# Patient Record
Sex: Female | Born: 1960 | Race: White | Hispanic: No | Marital: Married | Smoking: Current every day smoker
Health system: Southern US, Community
[De-identification: ages and names within clinical notes are randomized; demographics above are authoritative.]

## PROBLEM LIST (undated history)

## (undated) DIAGNOSIS — K529 Noninfective gastroenteritis and colitis, unspecified: Secondary | ICD-10-CM

---

## 1997-11-21 ENCOUNTER — Inpatient Hospital Stay (HOSPITAL_COMMUNITY): Admission: AD | Admit: 1997-11-21 | Discharge: 1997-11-23 | Payer: Self-pay | Admitting: Obstetrics and Gynecology

## 1998-04-08 ENCOUNTER — Emergency Department (HOSPITAL_COMMUNITY): Admission: EM | Admit: 1998-04-08 | Discharge: 1998-04-08 | Payer: Self-pay | Admitting: Emergency Medicine

## 1999-06-25 ENCOUNTER — Other Ambulatory Visit: Admission: RE | Admit: 1999-06-25 | Discharge: 1999-06-25 | Payer: Self-pay | Admitting: *Deleted

## 2000-03-02 ENCOUNTER — Other Ambulatory Visit: Admission: RE | Admit: 2000-03-02 | Discharge: 2000-03-02 | Payer: Self-pay | Admitting: Obstetrics and Gynecology

## 2000-09-15 ENCOUNTER — Inpatient Hospital Stay (HOSPITAL_COMMUNITY): Admission: AD | Admit: 2000-09-15 | Discharge: 2000-09-15 | Payer: Self-pay | Admitting: Obstetrics and Gynecology

## 2000-09-22 ENCOUNTER — Inpatient Hospital Stay (HOSPITAL_COMMUNITY): Admission: AD | Admit: 2000-09-22 | Discharge: 2000-09-24 | Payer: Self-pay | Admitting: Obstetrics and Gynecology

## 2000-11-05 ENCOUNTER — Other Ambulatory Visit: Admission: RE | Admit: 2000-11-05 | Discharge: 2000-11-05 | Payer: Self-pay | Admitting: Obstetrics and Gynecology

## 2001-11-10 ENCOUNTER — Other Ambulatory Visit: Admission: RE | Admit: 2001-11-10 | Discharge: 2001-11-10 | Payer: Self-pay | Admitting: Obstetrics and Gynecology

## 2003-08-14 ENCOUNTER — Encounter: Admission: RE | Admit: 2003-08-14 | Discharge: 2003-08-14 | Payer: Self-pay | Admitting: Obstetrics and Gynecology

## 2004-10-31 ENCOUNTER — Encounter: Admission: RE | Admit: 2004-10-31 | Discharge: 2004-10-31 | Payer: Self-pay | Admitting: Obstetrics and Gynecology

## 2005-12-16 ENCOUNTER — Encounter: Admission: RE | Admit: 2005-12-16 | Discharge: 2005-12-16 | Payer: Self-pay | Admitting: Obstetrics and Gynecology

## 2006-06-14 ENCOUNTER — Ambulatory Visit (HOSPITAL_COMMUNITY): Admission: RE | Admit: 2006-06-14 | Discharge: 2006-06-14 | Payer: Self-pay | Admitting: Obstetrics and Gynecology

## 2006-06-14 ENCOUNTER — Encounter (INDEPENDENT_AMBULATORY_CARE_PROVIDER_SITE_OTHER): Payer: Self-pay | Admitting: *Deleted

## 2007-03-17 ENCOUNTER — Encounter: Admission: RE | Admit: 2007-03-17 | Discharge: 2007-03-17 | Payer: Self-pay | Admitting: Obstetrics and Gynecology

## 2008-04-19 ENCOUNTER — Encounter: Admission: RE | Admit: 2008-04-19 | Discharge: 2008-04-19 | Payer: Self-pay | Admitting: Obstetrics and Gynecology

## 2009-04-22 ENCOUNTER — Encounter: Admission: RE | Admit: 2009-04-22 | Discharge: 2009-04-22 | Payer: Self-pay | Admitting: Obstetrics and Gynecology

## 2010-09-16 ENCOUNTER — Encounter
Admission: RE | Admit: 2010-09-16 | Discharge: 2010-09-16 | Payer: Self-pay | Source: Home / Self Care | Attending: Obstetrics and Gynecology | Admitting: Obstetrics and Gynecology

## 2011-02-06 NOTE — Op Note (Signed)
[name], Jocelyn West               ACCOUNT NO.:  [id number]   MEDICAL RECORD NO.:  [id number]          PATIENT TYPE:  AMB   LOCATION:  SDC                           FACILITY:  [facility]   PHYSICIAN:  Lenoard Aden, M.D.DATE OF BIRTH:  08/09/1961   DATE OF PROCEDURE:  06/14/2006  DATE OF DISCHARGE:                                 OPERATIVE REPORT   PREOPERATIVE DIAGNOSES:  1. Dysfunctional uterine bleeding.  2. Menorrhagia.   POSTOPERATIVE DIAGNOSES:  Large endometrial polyp.   OPERATION/PROCEDURE:  1. Diagnostic hysteroscopy.  2. Resectoscopic polypectomy.  3. Dilatation and curettage.   SURGEON:  Lenoard Aden, M.D.   ANESTHESIA:  General.   ESTIMATED BLOOD LOSS:  Less than 50 mL.   COMPLICATIONS:  None.   FLUID DEFICIT:  85 mL.   CONDITION:  To the recovery room in good condition.   DESCRIPTION OF PROCEDURE:  After being apprised of the risks of anesthesia,  infection, bleeding, injury to abdominal organs and need for repair,  __________  complications, to include bowel and bladder injury, possible  incidence of uterine perforation with need for repair, the patient was  brought to the operating room where she was administered general anesthetic  without complications, prepped and draped in the usual sterile fashion,  catheterized until the bladder was empty.  After achieving adequate  anesthesia, dilute Marcaine solution was placed for paracervical block.  Pitressin solution placed at 3 and 9 o'clock at the cervicovaginal junction.  Cervix easily dilated to #29 [facility] dilator.  Hysteroscope was placed.  Visualization reveals a large, broad-based posterior lateral wall  endometrial polyp which is resected using a double right-angle loop in  multiple passes in its entirety down to the level of the endometrium. Stalk  was resected completely.  There was some thickening of the endometrium along  the  right lateral wall which was resected as well. Bilateral normal tubal  ostia.  No intrauterine fibroids are noted.  At this time good hemostasis is noted.  Irrigated and fluid deficit of 85 mL obtained. All instruments removed.  The  patient tolerated the procedure well and is transferred to the recovery room  in good condition.      Lenoard Aden, M.D.  Electronically Signed     RJT/MEDQ  D:  06/14/2006  T:  06/15/2006  Job:  [id number]

## 2011-02-06 NOTE — H&P (Signed)
[facility]  Patient:    Jocelyn West, Jocelyn West                      MRN: [id number] Adm. Date:  [id number] Disc. Date: [id number] Attending:  Marina Gravel B                         History and Physical  REASON FOR ADMISSION:         1. Intrauterine pregnancy at 39+ weeks.                               2. Regular contractions.  HISTORY OF PRESENT ILLNESS:   This is a 50 year old married white female, gravida 5, para 2, abortus 2, with a due date of September 25, 2000, being admitted at 39 weeks 4 days for regular uterine contractions since 5:15 this morning.  The patient reports good fetal activity.  Denies any leaking of fluid or bleeding, and denies any symptoms of pregnancy-induced hypertension. Upon arrival at maternity admissions, she was contracting every two to four minutes, palpating mild to moderate, with a reactive fetal heart rate, and a vaginal exam of 4-5 cm, 80% effaced, vertex -1, with intact membranes.  PRENATAL LABORATORY DATA:     Blood type B positive.  RPR nonreactive. Rubella immune.  HBsAg negative.  HIV declined.  A 16-week AFP within normal limits.  A 16-week amniocentesis declined.  A 28-week glucose tolerance test was within normal limits.  A 35-week group B strep was negative.  Twenty-week ultrasound revealed a normal anatomy survey with an anterior placenta.  Prenatal course was otherwise uneventful.  ALLERGIES:                    No known drug allergies.  OBSTETRICAL HISTORY:          April 1994, scheduled cesarean section at 39 weeks for a female infant weighing 8 pounds 1 ounce, for breech presentation and failure of external cephalic version.  June 1996, spontaneous miscarriage at 7 weeks, no D&C, no complications.  December 1997, missed abortion at 11 weeks, needing D&C, no complications.  March 1999, spontaneous vaginal delivery at 41 weeks postinduction of a female infant weighing 8 pounds 7 ounces, no complications.  PAST  MEDICAL HISTORY:         Fibroids.  FAMILY HISTORY:               Mother with chronic hypertension.  Maternal grandmother with kidney cancer.  SOCIAL HISTORY:               Married.  Nonsmoker.  Is a Consulting civil engineer at [employer].  PHYSICAL EXAMINATION:  VITAL SIGNS:                  Blood pressure slightly elevated at 130/85.  HEENT:                        Negative.  LUNGS:                        Clear.  HEART:                        Normal.  ABDOMEN:  Gravid, nontender.  PELVIC:                       On vaginal exam 5 cm, 90% effaced, vertex, -1.  EXTREMITIES:                  Negative.  Fetal heart rate tracings reassuring and reactive.  ASSESSMENT:                   1. Intrauterine pregnancy at 39 weeks 4 days.                               2. Status post low transverse cesarean section.                               3. Status post successful vaginal birth after                                  cesarean section.                               4. Active labor.  PLAN:                         The patient is admitted to labor and delivery. Epidural for pain management.  Will do AROM after that. DD:  09/22/00 TD:  09/22/00 Job: [id number]

## 2012-03-23 ENCOUNTER — Other Ambulatory Visit: Payer: Self-pay | Admitting: Obstetrics and Gynecology

## 2012-03-23 DIAGNOSIS — Z1231 Encounter for screening mammogram for malignant neoplasm of breast: Secondary | ICD-10-CM

## 2012-04-25 ENCOUNTER — Ambulatory Visit: Payer: Self-pay

## 2012-04-28 ENCOUNTER — Ambulatory Visit: Payer: Self-pay

## 2012-12-12 ENCOUNTER — Other Ambulatory Visit: Payer: Self-pay

## 2012-12-12 DIAGNOSIS — Z1231 Encounter for screening mammogram for malignant neoplasm of breast: Secondary | ICD-10-CM

## 2013-01-10 ENCOUNTER — Ambulatory Visit
Admission: RE | Admit: 2013-01-10 | Discharge: 2013-01-10 | Disposition: A | Discharge status: Home / Self Care | Payer: BC Managed Care – PPO | Source: Ambulatory Visit

## 2013-01-10 DIAGNOSIS — Z1231 Encounter for screening mammogram for malignant neoplasm of breast: Secondary | ICD-10-CM

## 2015-05-19 ENCOUNTER — Other Ambulatory Visit (HOSPITAL_BASED_OUTPATIENT_CLINIC_OR_DEPARTMENT_OTHER): Payer: Self-pay | Admitting: Family Medicine

## 2015-05-19 ENCOUNTER — Ambulatory Visit (HOSPITAL_BASED_OUTPATIENT_CLINIC_OR_DEPARTMENT_OTHER)
Admission: RE | Admit: 2015-05-19 | Discharge: 2015-05-19 | Disposition: A | Discharge status: Home / Self Care | Payer: Managed Care, Other (non HMO) | Source: Ambulatory Visit | Attending: Family Medicine | Admitting: Family Medicine

## 2015-05-19 DIAGNOSIS — R52 Pain, unspecified: Secondary | ICD-10-CM

## 2015-05-19 DIAGNOSIS — S2232XA Fracture of one rib, left side, initial encounter for closed fracture: Secondary | ICD-10-CM | POA: Insufficient documentation

## 2015-05-19 DIAGNOSIS — W19XXXA Unspecified fall, initial encounter: Secondary | ICD-10-CM | POA: Insufficient documentation

## 2015-05-19 DIAGNOSIS — R079 Chest pain, unspecified: Secondary | ICD-10-CM | POA: Diagnosis present

## 2016-11-27 IMAGING — CR DG RIBS W/ CHEST 3+V*L*
3 series · 3 of 3 positions shown · non-contrast
Comparison: None.

CLINICAL DATA: Status post fall last night with pain under the left
breast.

EXAM:
LEFT RIBS AND CHEST - 3+ VIEW

[w chest pa]
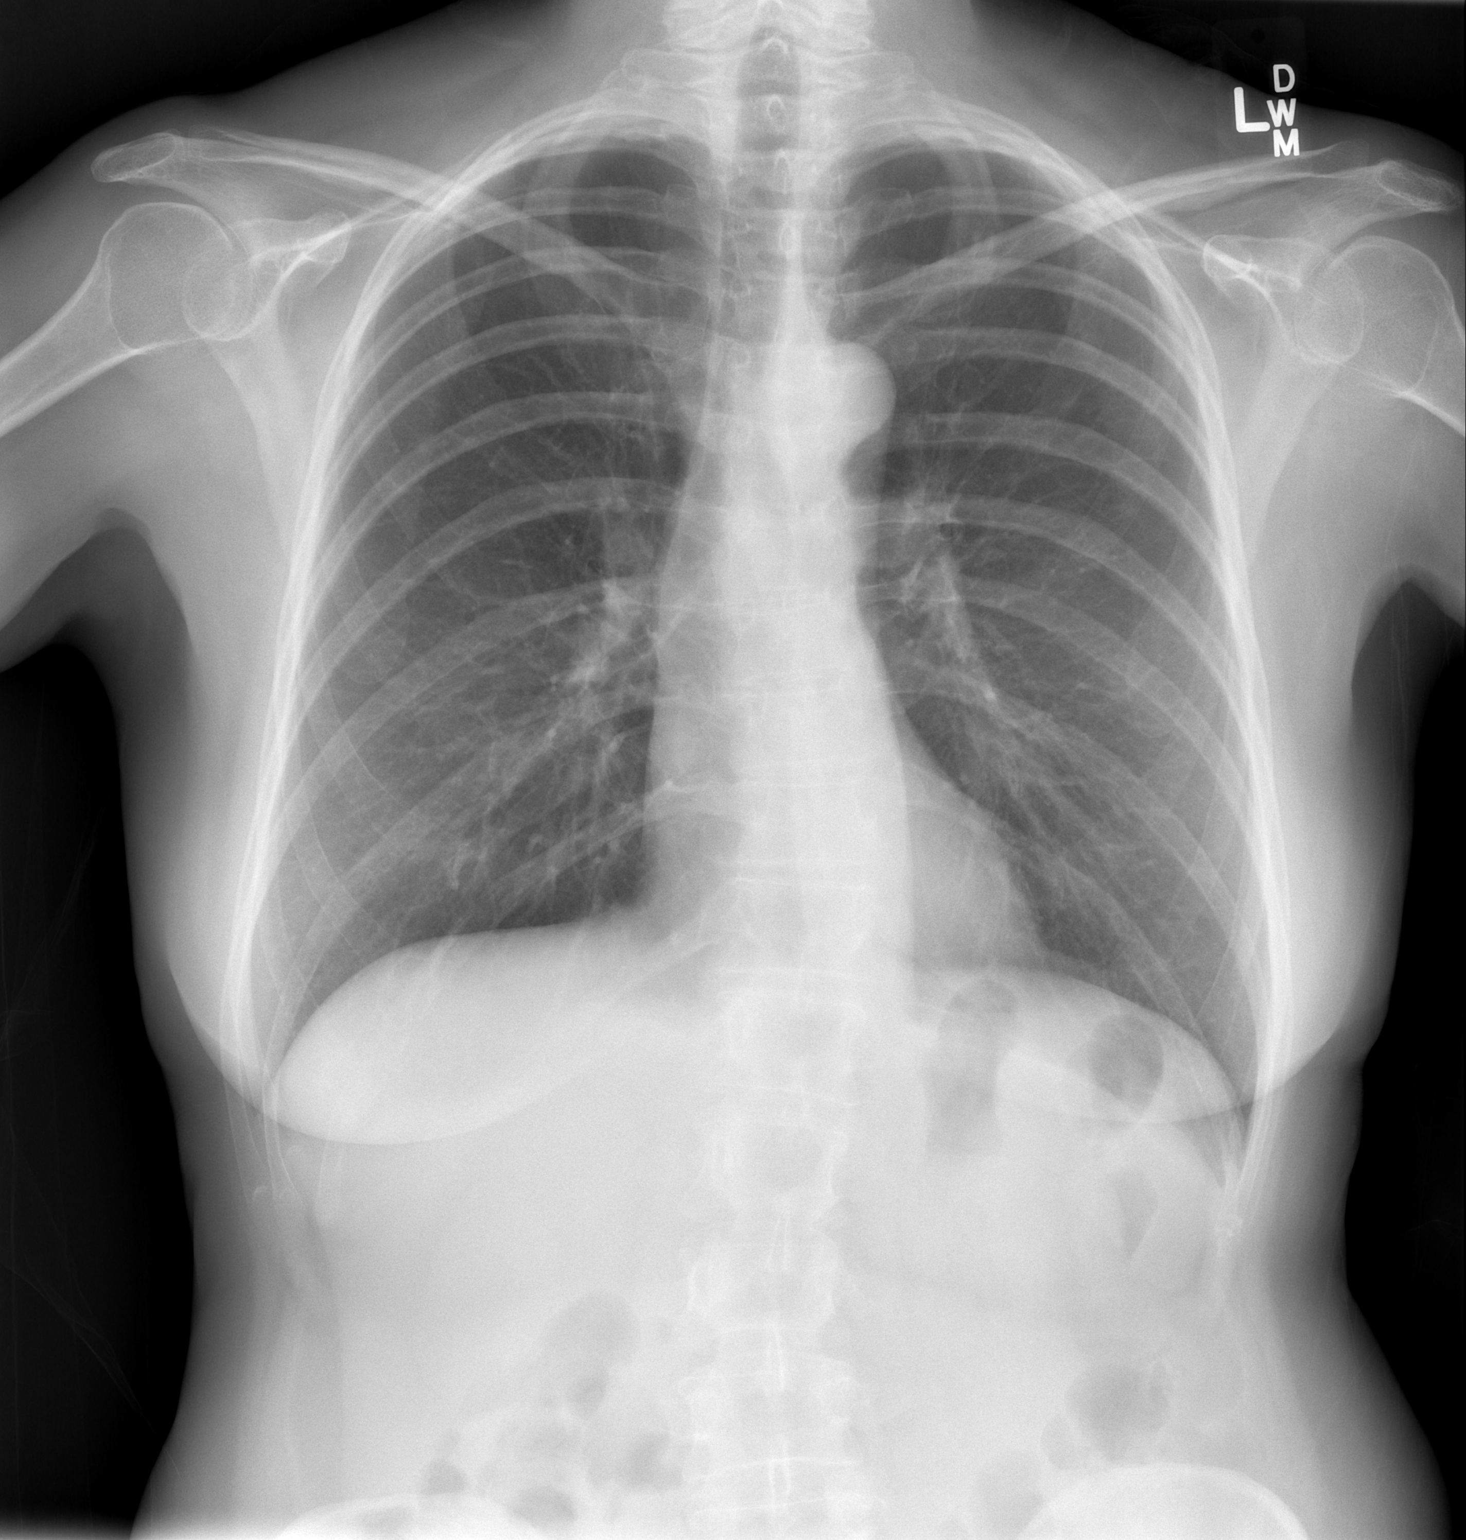

[w ribs ap/pa upper left]
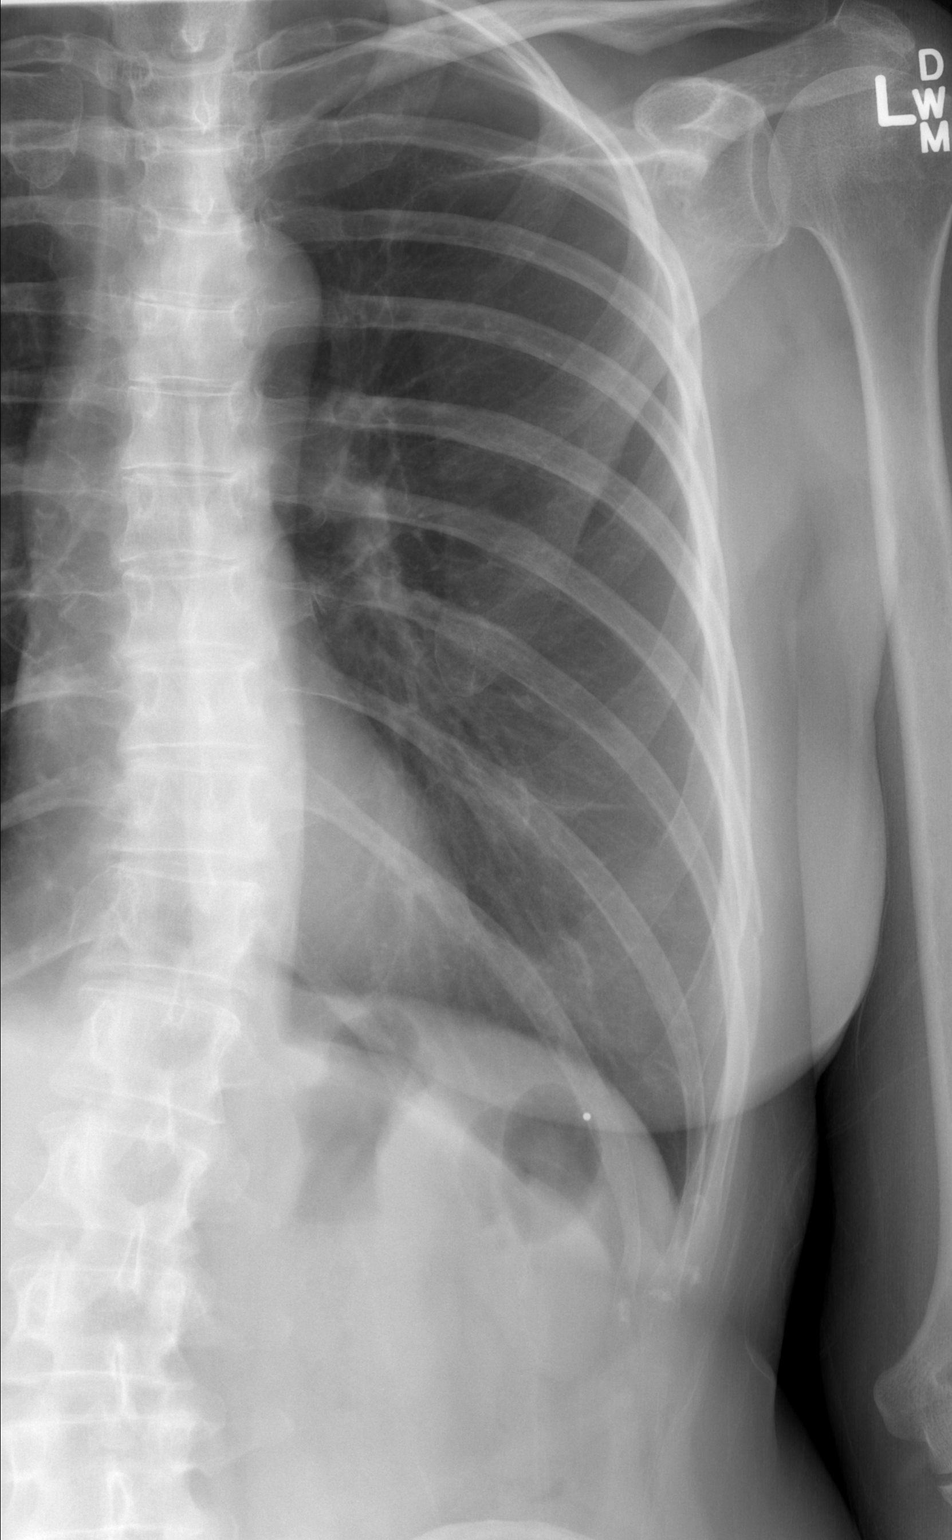

[w ribs ap/pa lower left]
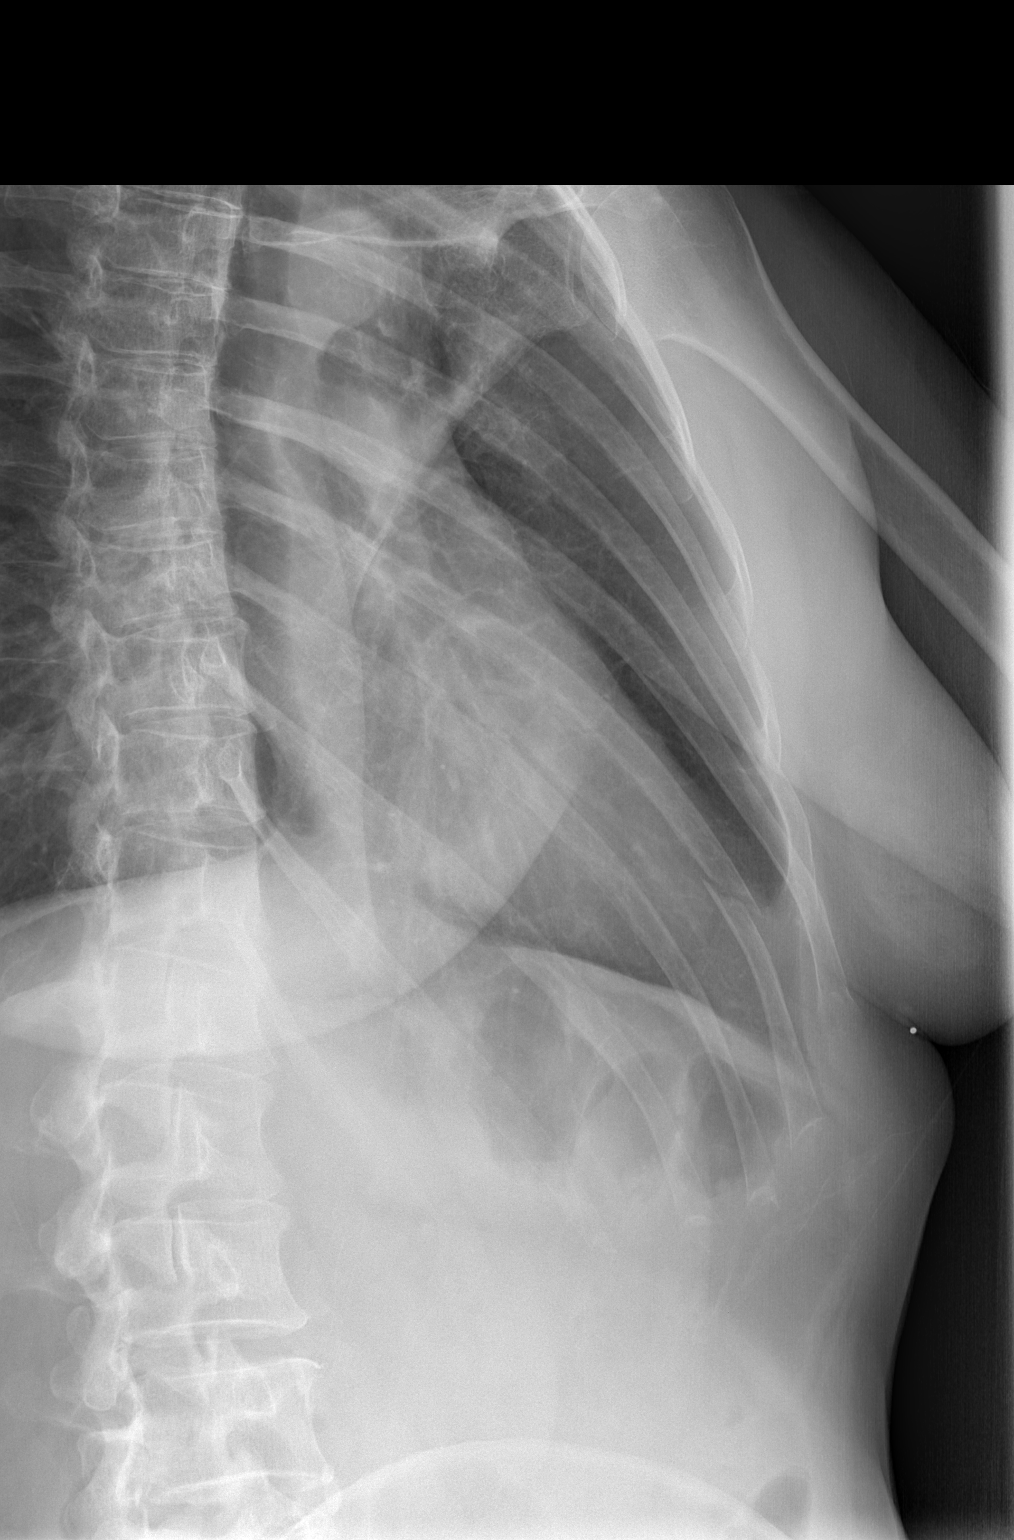

[3 of 3 positions shown; findings below may reference images not displayed]

FINDINGS: There is mild displaced fracture of the lateral left eighth rib.
There is no pneumothorax. There is no dislocation. The lungs are
clear. The mediastinal contour and cardiac silhouette are normal.
There is scoliosis of spine.
IMPRESSION: Fracture of the left eighth rib.

## 2018-07-02 ENCOUNTER — Other Ambulatory Visit: Payer: Self-pay

## 2018-07-02 ENCOUNTER — Encounter (HOSPITAL_BASED_OUTPATIENT_CLINIC_OR_DEPARTMENT_OTHER): Payer: Self-pay | Admitting: *Deleted

## 2018-07-02 ENCOUNTER — Emergency Department (HOSPITAL_BASED_OUTPATIENT_CLINIC_OR_DEPARTMENT_OTHER)
Admission: EM | Admit: 2018-07-02 | Discharge: 2018-07-02 | Disposition: A | Discharge status: Home / Self Care | Payer: Managed Care, Other (non HMO) | Attending: Emergency Medicine | Admitting: Emergency Medicine

## 2018-07-02 DIAGNOSIS — R197 Diarrhea, unspecified: Secondary | ICD-10-CM

## 2018-07-02 DIAGNOSIS — E860 Dehydration: Secondary | ICD-10-CM | POA: Diagnosis not present

## 2018-07-02 DIAGNOSIS — F17200 Nicotine dependence, unspecified, uncomplicated: Secondary | ICD-10-CM | POA: Diagnosis not present

## 2018-07-02 DIAGNOSIS — E878 Other disorders of electrolyte and fluid balance, not elsewhere classified: Secondary | ICD-10-CM | POA: Diagnosis not present

## 2018-07-02 HISTORY — DX: Noninfective gastroenteritis and colitis, unspecified: K52.9

## 2018-07-02 LAB — COMPREHENSIVE METABOLIC PANEL
ALT: 25 U/L (ref 0–44)
AST: 35 U/L (ref 15–41)
Albumin: 4.5 g/dL (ref 3.5–5.0)
Alkaline Phosphatase: 62 U/L (ref 38–126)
Anion gap: 16 — ABNORMAL HIGH (ref 5–15)
BUN: 6 mg/dL (ref 6–20)
CO2: 28 mmol/L (ref 22–32)
Calcium: 10.3 mg/dL (ref 8.9–10.3)
Chloride: 84 mmol/L — ABNORMAL LOW (ref 98–111)
Creatinine, Ser: 0.87 mg/dL (ref 0.44–1.00)
GFR calc Af Amer: >60 mL/min (ref >60)
GFR calc non Af Amer: >60 mL/min (ref >60)
Glucose, Bld: 100 mg/dL — ABNORMAL HIGH (ref 70–99)
Potassium: 2.9 mmol/L — ABNORMAL LOW (ref 3.5–5.1)
Sodium: 128 mmol/L — ABNORMAL LOW (ref 135–145)
Total Bilirubin: 0.6 mg/dL (ref 0.3–1.2)
Total Protein: 7.6 g/dL (ref 6.5–8.1)

## 2018-07-02 LAB — CBC
HCT: 46.6 % — ABNORMAL HIGH (ref 36.0–46.0)
Hemoglobin: 16.6 g/dL — ABNORMAL HIGH (ref 12.0–15.0)
MCH: 33.1 pg (ref 26.0–34.0)
MCHC: 35.6 g/dL (ref 30.0–36.0)
MCV: 92.8 fL (ref 80.0–100.0)
Platelets: 291 10*3/uL (ref 150–400)
RBC: 5.02 MIL/uL (ref 3.87–5.11)
RDW: 12.1 % (ref 11.5–15.5)
WBC: 6.7 10*3/uL (ref 4.0–10.5)
nRBC: 0 % (ref 0.0–0.2)

## 2018-07-02 LAB — URINALYSIS, MICROSCOPIC (REFLEX)

## 2018-07-02 LAB — URINALYSIS, ROUTINE W REFLEX MICROSCOPIC
Bilirubin Urine: NEGATIVE
Glucose, UA: NEGATIVE mg/dL
Ketones, ur: 15 mg/dL — AB
Nitrite: NEGATIVE
Protein, ur: NEGATIVE mg/dL
Specific Gravity, Urine: 1.01 (ref 1.005–1.030)
pH: 5.5 (ref 5.0–8.0)

## 2018-07-02 LAB — MAGNESIUM: Magnesium: 1.7 mg/dL (ref 1.7–2.4)

## 2018-07-02 LAB — LIPASE, BLOOD: Lipase: 34 U/L (ref 11–51)

## 2018-07-02 MED ORDER — POTASSIUM CHLORIDE CRYS ER 20 MEQ PO TBCR
40.0000 meq | EXTENDED_RELEASE_TABLET | Freq: Once | ORAL | Status: AC
  Administered 2018-07-02: 40 meq via ORAL
  Filled 2018-07-02: qty 2

## 2018-07-02 MED ORDER — SODIUM CHLORIDE 0.9 % IV BOLUS
1000.0000 mL | Freq: Once | INTRAVENOUS | Status: AC
  Administered 2018-07-02: 1000 mL via INTRAVENOUS

## 2018-07-02 MED ORDER — FAMOTIDINE 20 MG PO TABS
20.0000 mg | ORAL_TABLET | Freq: Once | ORAL | Status: DC
  Filled 2018-07-02: qty 1

## 2018-07-02 MED ORDER — PROMETHAZINE HCL 25 MG PO TABS
12.5000 mg | ORAL_TABLET | Freq: Once | ORAL | Status: AC
  Administered 2018-07-02: 12.5 mg via ORAL
  Filled 2018-07-02: qty 1

## 2018-07-02 MED ORDER — POTASSIUM CHLORIDE 10 MEQ/100ML IV SOLN
10.0000 meq | Freq: Once | INTRAVENOUS | Status: AC
  Administered 2018-07-02: 10 meq via INTRAVENOUS
  Filled 2018-07-02: qty 100

## 2018-07-02 MED ORDER — METOCLOPRAMIDE HCL 10 MG PO TABS: 10.0000 mg | ORAL_TABLET | Freq: Four times a day (QID) | ORAL | 0 refills | Status: AC

## 2018-07-02 NOTE — ED Notes (Addendum)
Pt. Has been experiencing nausea and  diarrhea for approx. 2 weeks daily .  Pt was seen Wednesday by PA at PCP. She is treating with immodium and was prescribed zofran for nausea. She called PCP today and she states was told she could be dehydrated and was recommended she be treated at an ER or Urgent care to receive fluids.

## 2018-07-02 NOTE — ED Triage Notes (Signed)
Abdominal cramps. Vomiting and diarrhea x 2 weeks. She was seen by her MD for same and states she is here for dehydration.

## 2018-07-02 NOTE — ED Notes (Signed)
States," I feel like a new woman, I feel so much better"

## 2018-07-02 NOTE — ED Provider Notes (Signed)
[facility] EMERGENCY DEPARTMENT Provider Note   CSN: [id number] Arrival date & time: 07/02/18  1154     History   Chief Complaint Chief Complaint  Patient presents with  . Emesis  . Diarrhea    HPI Jocelyn West is a 57 y.o. female.  HPI   Patient is a 57 year old female with history of collagenous colitis who presents the emergency department today for evaluation of nausea and diarrhea that have been present for the last 2 weeks.  Patient reports about 10 episodes of watery, nonbloody diarrhea that have occurred daily for the last 2 weeks.  She has been taking Imodium with mild relief of symptoms.  She has also been taking Zofran with no relief of her nausea.  She denies vomiting, abdominal pain, fevers, chills, dysuria, frequency, urgency or hematuria.  She was told to come to the ED by her PCP as she may need IV fluid hydration due to dehydration.  She was seen by her PCP recently for her symptoms and had C. difficile and GI panel completed.    Reviewed records.  GI panel and C. difficile testing from 06/30/18 were negative.  She was advised by her PCP that if her symptoms continue she will need to follow-up with her GI specialist.  She states she is to be followed by GI for her collagenous colitis and was previously on budesonide.  However, she has not had a flareup in a long time and is no longer taking this medication.  A referral has ready been made for her to see GI however she has not contacted them to make an appointment yet.  Past Medical History:  Diagnosis Date  . Colitis     There are no active problems to display for this patient.   Past Surgical History:  Procedure Laterality Date  . CESAREAN SECTION       OB History   None      Home Medications    Prior to Admission medications   Medication Sig Start Date End Date Taking? Authorizing Provider  clonazePAM (KLONOPIN) 0.5 MG tablet Take 0.5 mg by mouth 2 (two) times daily as needed for  anxiety.   Yes [provider]  LOSARTAN POTASSIUM PO Take by mouth.   Yes [provider]  SIMVASTATIN PO Take by mouth.   Yes [provider]    Family History No family history on file.  Social History Social History   Tobacco Use  . Smoking status: Current Every Day Smoker  . Smokeless tobacco: Never Used  Substance Use Topics  . Alcohol use: Yes  . Drug use: Never     Allergies   Patient has no known allergies.   Review of Systems Review of Systems  Constitutional: Negative for chills and fever.  HENT: Negative for ear pain and sore throat.   Eyes: Negative for visual disturbance.  Respiratory: Negative for cough and shortness of breath.   Cardiovascular: Negative for chest pain.  Gastrointestinal: Positive for diarrhea. Negative for abdominal pain, blood in stool, constipation, nausea and vomiting.  Genitourinary: Negative for dysuria, flank pain, frequency and hematuria.  Musculoskeletal: Negative for back pain.  Skin: Negative for rash.  Neurological: Positive for light-headedness.  All other systems reviewed and are negative.    Physical Exam Updated Vital Signs BP (!) 136/100   Pulse 76   Temp 98.1 F (36.7 C) (Oral)   Resp 15   Ht 5' 3.5" (1.613 m)   Wt 51.7 kg  SpO2 97%   BMI 19.88 kg/m   Physical Exam  Constitutional: She appears well-developed and well-nourished. No distress.  HENT:  Head: Normocephalic and atraumatic.  Mucous membranes are dry  Eyes: Conjunctivae are normal.  Neck: Neck supple.  Cardiovascular: Normal rate, regular rhythm and normal heart sounds.  Pulmonary/Chest: Effort normal and breath sounds normal. No respiratory distress. She has no wheezes.  Abdominal: Soft. Bowel sounds are normal. She exhibits no distension. There is no tenderness. There is no guarding.  No CVA TTP  Musculoskeletal: Normal range of motion.  Neurological: She is alert.  Skin: Skin is warm and dry.  Psychiatric: She  has a normal mood and affect.  Nursing note and vitals reviewed.  ED Treatments / Results  Labs (all labs ordered are listed, but only abnormal results are displayed) Labs Reviewed  URINALYSIS, ROUTINE W REFLEX MICROSCOPIC - Abnormal; Notable for the following components:      Result Value   Hgb urine dipstick TRACE (*)    Ketones, ur 15 (*)    Leukocytes, UA LARGE (*)    All other components within normal limits  COMPREHENSIVE METABOLIC PANEL - Abnormal; Notable for the following components:   Sodium 128 (*)    Potassium 2.9 (*)    Chloride 84 (*)    Glucose, Bld 100 (*)    Anion gap 16 (*)    All other components within normal limits  CBC - Abnormal; Notable for the following components:   Hemoglobin 16.6 (*)    HCT 46.6 (*)    All other components within normal limits  URINALYSIS, MICROSCOPIC (REFLEX) - Abnormal; Notable for the following components:   Bacteria, UA RARE (*)    All other components within normal limits  URINE CULTURE  LIPASE, BLOOD  MAGNESIUM    EKG EKG Interpretation  Date/Time:  Saturday July 02 2018 13:34:49 EDT Ventricular Rate:  68 PR Interval:    QRS Duration: 107 QT Interval:  434 QTC Calculation: 462 R Axis:   18 Text Interpretation:  Sinus rhythm Probable anteroseptal infarct, old no ischemic appearance. no old comparison Confirmed by Arby Barrette [phone]) on 07/02/2018 2:31:49 PM     Radiology No results found.  Procedures Procedures (including critical care time)  Medications Ordered in ED Medications  promethazine (PHENERGAN) tablet 12.5 mg (12.5 mg Oral Given 07/02/18 1300)  sodium chloride 0.9 % bolus 1,000 mL (0 mLs Intravenous Stopped 07/02/18 1413)  potassium chloride SA (K-DUR,KLOR-CON) CR tablet 40 mEq (40 mEq Oral Given 07/02/18 1317)  potassium chloride 10 mEq in 100 mL IVPB (0 mEq Intravenous Stopped 07/02/18 1413)     Initial Impression / Assessment and Plan / ED Course  I have reviewed the triage vital signs  and the nursing notes.  Pertinent labs & imaging results that were available during my care of the patient were reviewed by me and considered in my medical decision making (see chart for details).   Final Clinical Impressions(s) / ED Diagnoses   Final diagnoses:  Dehydration  Electrolyte abnormality  Diarrhea, unspecified type   Patient presenting with diarrhea and nausea for the last 2 weeks.  Has history of collagenous colitis, but has not had a flareup in years and is not currently on any medication for this.  I suspect that her symptoms are due to a flareup of this.  She recently had negative C. difficile and GI panel by PCP.  Has been having temporary improvement of her symptoms with Imodium at home.  No  improvement with Zofran.  Patient with nontender abdomen in the ED today.  Labs without leukocytosis or anemia.  CMP with electrolyte abnormalities including hyponatremia, hypokalemia and hypochloremia.  Slightly elevated anion gap, glucose is normal.  Magnesium is within normal limits.  UA with ketones, hematuria and leukocytes.  No white blood cells or bacteria on microscopic UA.  Doubt UTI.  Culture sent. EKG without prolonged QTc.  Normal sinus rhythm without ischemic changes.  Lab and urinalysis abnormalities are likely due to dehydration continues diarrhea.  Was given dose of Reglan and has had no episodes of vomiting.  She is tolerating p.o.  She was given fluid bolus as well as potassium supplementation and she feels much improved.  States she is ready for discharge.  Discussed the results of her lab work including electrolyte abnormalities.  Advised that she needs to have her electrolytes rechecked by her PCP later this week.  Will give Rx for Phenergan as she had significant improvement with this medication.  Advised her to stay hydrated and return to the ER for she has any new or worsening symptoms in the meantime.  She plans to follow-up with her GI doctor ASAP.  Patient voiced  understanding the plan reasons to return immediately to the ED peer all questions answered.  ED Discharge Orders    None       Karrie Meres, [place] 07/02/18 1441    Arby Barrette, MD 07/02/18 1730

## 2018-07-02 NOTE — ED Notes (Signed)
Pt is aware that we need urine specimen for culture.

## 2018-07-02 NOTE — Discharge Instructions (Addendum)
You need to have your electrolytes rechecked next week as her potassium, chloride and sodium were low today.  You are given a prescription for Reglan.  Please take this medication as directed.  Please contact your gastroenterology doctor to make an appointment for follow-up.  Please return to the emergency department for any new or worsening symptoms in the meantime including persistent abdominal pain, dehydration, fevers, or urinary symptoms.

## 2018-07-03 LAB — URINE CULTURE: Culture: NO GROWTH

## 2023-05-09 ENCOUNTER — Emergency Department (HOSPITAL_BASED_OUTPATIENT_CLINIC_OR_DEPARTMENT_OTHER): Payer: 59

## 2023-05-09 ENCOUNTER — Emergency Department (HOSPITAL_BASED_OUTPATIENT_CLINIC_OR_DEPARTMENT_OTHER)
Admission: EM | Admit: 2023-05-09 | Discharge: 2023-05-09 | Disposition: A | Discharge status: Home / Self Care | Payer: 59 | Attending: Emergency Medicine | Admitting: Emergency Medicine

## 2023-05-09 ENCOUNTER — Other Ambulatory Visit: Payer: Self-pay

## 2023-05-09 ENCOUNTER — Encounter (HOSPITAL_BASED_OUTPATIENT_CLINIC_OR_DEPARTMENT_OTHER): Payer: Self-pay | Admitting: Emergency Medicine

## 2023-05-09 DIAGNOSIS — S61411A Laceration without foreign body of right hand, initial encounter: Secondary | ICD-10-CM | POA: Diagnosis present

## 2023-05-09 DIAGNOSIS — W010XXA Fall on same level from slipping, tripping and stumbling without subsequent striking against object, initial encounter: Secondary | ICD-10-CM | POA: Diagnosis not present

## 2023-05-09 MED ORDER — LIDOCAINE HCL (PF) 1 % IJ SOLN
10.0000 mL | Freq: Once | INTRAMUSCULAR | Status: AC
  Administered 2023-05-09: 10 mL
  Filled 2023-05-09: qty 10

## 2023-05-09 MED ORDER — DOXYCYCLINE HYCLATE 100 MG PO CAPS: 100.0000 mg | ORAL_CAPSULE | Freq: Two times a day (BID) | ORAL | 0 refills | Status: AC

## 2023-05-09 NOTE — Discharge Instructions (Addendum)
It was a pleasure taking care of you today!   You may return to urgent care or return to the emergency department for suture removal in 7-10 days.  Refrain from showering for the next 24-48 hours. If the area gets wet, then dab it to dry it. Should you need to clean the area, you may use mild soap and water and gently dab at the area. Do not aggressively rub at the area. Keep the area clean and dry. The wound today will be covered with a dressing, you may remove the dressing in 48 hours. At that time, refrain from aggressively moving your thumb. You may redress the wound as needed. You will be sent a prescription for doxycycline, take as prescribed. Return to the emergency department if worsening or persistent pain, drainage of wound, increased swelling, or color change to area.

## 2023-05-09 NOTE — ED Notes (Signed)
Dressing applied and patient given extra supplies and educated on use.

## 2023-05-09 NOTE — ED Provider Notes (Signed)
[facility] EMERGENCY DEPARTMENT AT [facility] Provider Note   CSN: [id number] Arrival date & time: 05/09/23  2725     History  Chief Complaint  Patient presents with   Laceration    AMBOR HRNCIR is a 62 y.o. female who presents emergency department with concerns for laceration to her right hand onset last night at 11 PM.  Notes that she was walking upstairs of the glass in her hand when she tripped and accidentally smashed the glass against the wall while holding it.  Denies hitting her head or LOC.  No anticoagulant use.  Patient notes her tetanus is up-to-date.  The history is provided by the patient. No language interpreter was used.       Home Medications Prior to Admission medications   Medication Sig Start Date End Date Taking? Authorizing Provider  doxycycline (VIBRAMYCIN) 100 MG capsule Take 1 capsule (100 mg total) by mouth 2 (two) times daily for 5 days. 05/09/23 05/14/23 Yes Eean Buss A, PA-C  clonazePAM (KLONOPIN) 0.5 MG tablet Take 0.5 mg by mouth 2 (two) times daily as needed for anxiety.    [provider]  LOSARTAN POTASSIUM PO Take by mouth.    [provider]  metoCLOPramide (REGLAN) 10 MG tablet Take 1 tablet (10 mg total) by mouth every 6 (six) hours. 07/02/18   Couture, Cortni S, PA-C  SIMVASTATIN PO Take by mouth.    [provider]      Allergies    Patient has no known allergies.    Review of Systems   Review of Systems  All other systems reviewed and are negative.   Physical Exam Updated Vital Signs BP (!) 173/99   Pulse 80   Temp 97.7 F (36.5 C)   Resp 18   Ht 5\' 3"  (1.6 m)   Wt 54.4 kg   SpO2 98%   BMI 21.26 kg/m  Physical Exam Vitals and nursing note reviewed.  Constitutional:      General: She is not in acute distress.    Appearance: Normal appearance. She is not ill-appearing.  HENT:     Head: Normocephalic and atraumatic.     Right Ear: External ear normal.     Left Ear: External  ear normal.  Eyes:     General: No scleral icterus. Cardiovascular:     Rate and Rhythm: Normal rate.  Pulmonary:     Effort: Pulmonary effort is normal.  Musculoskeletal:        General: Normal range of motion.     Cervical back: Normal range of motion and neck supple.  Skin:    General: Skin is warm and dry.     Capillary Refill: Capillary refill takes less than 2 seconds.     Findings: Laceration present.     Comments: 4.5 cm laceration noted on the palmar aspect of the right hand distal to the right thumb.  Able to flex and extend right thumb against resistance without difficulty.  Radial pulse intact.  Grip strength 5/5.  Strength sensation intact.  Neurovascular intact.  Capillary refill less than 2 seconds.   Neurological:     Mental Status: She is alert.     ED Results / Procedures / Treatments   Labs (all labs ordered are listed, but only abnormal results are displayed) Labs Reviewed - No data to display  EKG None  Radiology DG Hand Complete Right  Result Date: 05/09/2023 CLINICAL DATA:  Fall with laceration of the base of  the thumb. EXAM: RIGHT HAND - COMPLETE 3+ VIEW COMPARISON:  None Available. FINDINGS: There is no evidence of fracture or dislocation. There is no evidence of arthropathy or other focal bone abnormality. There is soft tissue swelling of the hand. No radiopaque foreign body. IMPRESSION: No acute osseous injury or radiopaque foreign body. Electronically Signed   By: Romona Curls M.D.   On: 05/09/2023 09:24    Procedures .Marland KitchenLaceration Repair  Date/Time: 05/09/2023 10:34 AM  Performed by: Chestine Spore A, PA-C Authorized by: Karenann Cai, PA-C   Consent:    Consent obtained:  Verbal   Consent given by:  Patient   Risks discussed:  Infection, need for additional repair and pain Universal protocol:    Imaging studies available: yes     Patient identity confirmed:  Verbally with patient and hospital-assigned identification number Anesthesia:     Anesthesia method:  Local infiltration   Local anesthetic:  Lidocaine 1% w/o epi Laceration details:    Location:  Hand   Hand location:  R palm   Length (cm):  4.5 Pre-procedure details:    Preparation:  Patient was prepped and draped in usual sterile fashion and imaging obtained to evaluate for foreign bodies Exploration:    Hemostasis achieved with:  Direct pressure   Imaging obtained: x-ray     Imaging outcome: foreign body not noted     Wound exploration: entire depth of wound visualized   Treatment:    Area cleansed with:  Saline   Amount of cleaning:  Standard   Irrigation solution:  Sterile saline   Irrigation method:  Syringe Skin repair:    Repair method:  Sutures   Suture size:  5-0   Suture material:  Prolene   Suture technique:  Simple interrupted   Number of sutures:  11 Approximation:    Approximation:  Close Repair type:    Repair type:  Simple Post-procedure details:    Dressing:  Non-adherent dressing and splint for protection   Procedure completion:  Tolerated well, no immediate complications     Medications Ordered in ED Medications  lidocaine (PF) (XYLOCAINE) 1 % injection 10 mL (10 mLs Infiltration Given by Other 05/09/23 0940)    ED Course/ Medical Decision Making/ A&P                                 Medical Decision Making Amount and/or Complexity of Data Reviewed Radiology: ordered.  Risk Prescription drug management.   Patient presents with laceration noted to her right hand onset last night at 11 PM. Pt is not on anticoagulants at this time. Vital signs, patient afebrile. On exam, patient with 4.5 cm laceration noted on the palmar aspect of the right hand distal to the right thumb.  Able to flex and extend right thumb against resistance without difficulty.  Radial pulse intact.  Grip strength 5/5.  Strength sensation intact.  Neurovascular intact.  Capillary refill less than 2 seconds.. Tetanus up-to-date as of 2023. Laceration occurred <  12 hours prior to repair. Differential diagnosis includes, fracture, foreign body, dislocation, avulsion.    Imaging: I ordered imaging studies including right hand xray  I independently visualized and interpreted imaging which showed:  No acute osseous injury or radiopaque foreign body.   I agree with the radiologist interpretation   Disposition: Presenting suspicious for laceration. Doubt fracture, dislocation, or foreign body at this time. Tetanus up-to-date. Wound thoroughly irrigated, no foreign  bodies noted. Laceration repaired in the ED today. After consideration of the diagnostic results and the patients response to treatment, I feel that the patient would benefit from Discharge home.  Patient placed in protective dressing.  Prescription for doxycycline sent to patient's pharmacy.  Discussed laceration care with pt and answered questions. Pt to follow up for suture/staple removal in 7-10 days and wound check sooner should there be signs of dehiscence or infection. Pt is hemodynamically stable with no complaints prior to discharge. Supportive care measures and strict return precautions discussed with patient at bedside. Pt acknowledges and verbalizes understanding. Pt appears safe for discharge. Follow up as indicated in discharge paperwork.    This chart was dictated using voice recognition software, Dragon. Despite the best efforts of this provider to proofread and correct errors, errors may still occur which can change documentation meaning.  Final Clinical Impression(s) / ED Diagnoses Final diagnoses:  Laceration of right hand without foreign body, initial encounter    Rx / DC Orders ED Discharge Orders          Ordered    doxycycline (VIBRAMYCIN) 100 MG capsule  2 times daily        05/09/23 1102              Nithya Meriweather A, PA-C 05/09/23 1104    Virgina Norfolk, DO 05/09/23 1130

## 2023-05-09 NOTE — ED Notes (Signed)
Wound flushed and cleaned with wound cleaner.

## 2023-05-09 NOTE — ED Triage Notes (Addendum)
Pt tripped on stairs with glass in her hand last night at 11 pm.   No head injury.  Pt has laceration to right hand near thumb.

## 2023-05-19 ENCOUNTER — Emergency Department (HOSPITAL_BASED_OUTPATIENT_CLINIC_OR_DEPARTMENT_OTHER)
Admission: EM | Admit: 2023-05-19 | Discharge: 2023-05-19 | Disposition: A | Discharge status: Home / Self Care | Payer: 59 | Attending: Emergency Medicine | Admitting: Emergency Medicine

## 2023-05-19 ENCOUNTER — Other Ambulatory Visit: Payer: Self-pay

## 2023-05-19 DIAGNOSIS — Z23 Encounter for immunization: Secondary | ICD-10-CM | POA: Diagnosis not present

## 2023-05-19 DIAGNOSIS — Z4802 Encounter for removal of sutures: Secondary | ICD-10-CM | POA: Diagnosis present

## 2023-05-19 MED ORDER — TETANUS-DIPHTH-ACELL PERTUSSIS 5-2.5-18.5 LF-MCG/0.5 IM SUSY
0.5000 mL | PREFILLED_SYRINGE | Freq: Once | INTRAMUSCULAR | Status: AC
  Administered 2023-05-19: 0.5 mL via INTRAMUSCULAR
  Filled 2023-05-19: qty 0.5

## 2023-05-19 NOTE — ED Triage Notes (Signed)
Pt reports to ED requesting stitches to be removed from her right thumb.

## 2023-05-19 NOTE — ED Notes (Signed)
ED Provider at bedside while in triage.  EDP removed sutures.

## 2023-05-19 NOTE — ED Provider Notes (Signed)
[facility] Provider Note   CSN: [id number] Arrival date & time: 05/19/23  1224     History  Chief Complaint  Patient presents with   Suture / Staple Removal    Jocelyn West is a 62 y.o. female.  62 year old female presents for suture removal from right hand.  Patient with laceration to right hand 10 days ago, closed with sutures in this ER.  Last tetanus is unknown and will be updated today.  No concerns otherwise.       Home Medications Prior to Admission medications   Medication Sig Start Date End Date Taking? Authorizing Provider  clonazePAM (KLONOPIN) 0.5 MG tablet Take 0.5 mg by mouth 2 (two) times daily as needed for anxiety.    [provider]  LOSARTAN POTASSIUM PO Take by mouth.    [provider]  metoCLOPramide (REGLAN) 10 MG tablet Take 1 tablet (10 mg total) by mouth every 6 (six) hours. 07/02/18   Couture, Cortni S, PA-C  SIMVASTATIN PO Take by mouth.    [provider]      Allergies    Patient has no known allergies.    Review of Systems   Review of Systems Negative except as per HPI Physical Exam Updated Vital Signs BP (!) 154/90 (BP Location: Left Arm)   Pulse 93   Temp 98.4 F (36.9 C)   Resp 18   Ht 5\' 3"  (1.6 m)   Wt 54.4 kg   SpO2 99%   BMI 21.26 kg/m  Physical Exam Vitals and nursing note reviewed.  Constitutional:      General: She is not in acute distress.    Appearance: She is well-developed. She is not diaphoretic.  HENT:     Head: Normocephalic and atraumatic.  Pulmonary:     Effort: Pulmonary effort is normal.  Musculoskeletal:        General: Signs of injury present. No swelling, tenderness or deformity. Normal range of motion.     Comments: Sutures in place/webspace between thumb and palm of right hand and index finger.  Healing without evidence of infection.  Skin:    General: Skin is warm and dry.     Findings: No erythema or rash.   Neurological:     Mental Status: She is alert and oriented to person, place, and time.     Sensory: No sensory deficit.     Motor: No weakness.  Psychiatric:        Behavior: Behavior normal.     ED Results / Procedures / Treatments   Labs (all labs ordered are listed, but only abnormal results are displayed) Labs Reviewed - No data to display  EKG None  Radiology No results found.  Procedures .Suture Removal  Date/Time: 05/19/2023 12:39 PM  Performed by: Jeannie Fend, PA-C Authorized by: Jeannie Fend, PA-C   Consent:    Consent obtained:  Verbal   Consent given by:  Patient   Risks, benefits, and alternatives were discussed: yes     Risks discussed:  Pain, wound separation and bleeding   Alternatives discussed:  No treatment Universal protocol:    Patient identity confirmed:  Verbally with patient Location:    Location:  Upper extremity   Upper extremity location:  Hand   Hand location:  R hand Procedure details:    Wound appearance:  No signs of infection   Sutures removed: multiple. Post-procedure details:    Post-removal:  No dressing applied  Procedure completion:  Tolerated well, no immediate complications     Medications Ordered in ED Medications  Tdap (BOOSTRIX) injection 0.5 mL (has no administration in time range)    ED Course/ Medical Decision Making/ A&P                                 Medical Decision Making  62 year old female here for suture removal from hand.  Sutures removed without difficulty.  Discussed expected care for wound at this time.  Recheck with PCP as needed.  Tetanus updated.        Final Clinical Impression(s) / ED Diagnoses Final diagnoses:  Visit for suture removal  Need for Tdap vaccination    Rx / DC Orders ED Discharge Orders     None         Jeannie Fend, PA-C 05/19/23 1241    Rondel Baton, MD 05/21/23 [phone]

## 2024-04-20 NOTE — Progress Notes (Signed)
 Patient presents for  Chief Complaint  Patient presents with  . Annual Exam   Labs collected/KBC, CMA HPI   Colonoscopy 08/24/23, f/u 10 yr (08/2033) Mammo 10/27/22 - DUE Pap 12/05/19, f/u 11/2024 LDCT ordered 12/01/22 - not done (last screen 10/2021 -  multiple small pulm nodules, very mild centrilobular/paraseptal emphysema, aortic atherosclerosis)  Hypertension Patient presents or hypertension follow up.  Currently on Losartan-HCTZ 100-12.5 Takes meds daily as rx. Does not check home BP. Denies chest pain, SOB, DOE, orthopnea, edema. Has been feeling light-headed (see below).    BP Readings from Last 3 Encounters:  04/20/24 96/67  01/06/24 118/84  11/29/23 146/82   GERD Takes Omeprazole every day  No chest  pain. No reflux or abdominal pain. No melena or brbpr. +nausea (see below).  Spicy foods are a trigger and she tries to avoid those.  Med is working well to control symptoms  New problems/Concerns:  Patient report feeling fatigue, restless legs, feeling bloated for 5 weeks. OTC medication used for nausea and dizziness, and for restless legs. Dx with restless leg syndrome. Uses an old script. Symptoms returned 2 weeks ago. Thinks she's dehydrated. Consume a bottle of water a day. Patient reports feeling dizzy for 4 weeks. Haven't seen anyone for it. When sitting she feels it and upon standing.  C/o daily nausea along with poor appetite for several weeks. No emesis. Nausea persists throughout the day. No abd pain but abd bloating. No diaphoresis. Will get light headed at times. States when she is sitting, she is fine. Gets light-headed if she stands or when she goes up the stairs. Feels weak. No chest pains or sob. States she is not sure what is going on. States she has not been doing well with drinking water and drinks one bottle/day. Despite the nausea and poor appetite, she is not losing weight. Has gained wt since 08/2023 -- 120 lb to 130 lb today.  States she had a cold 2  months ago -- coughing and sneezing. No fever. Husband thinks she had covid but she is not sure.  Has taken tylenol for the past few nights (one 500 mg tab over the past 5 nights) b/c of her RLS. Drinks about 3-4 glasses of alcohol/day.   Immunizations: Declines Prevnar, or shingles vaccine  Immunizations by Immunization Family     Covid-19 Vaccine Unspecified 10/10/2019 (63 y.o.) 11/01/2019 (63 y.o.)     Influenza, Injectable, Quadrivalent, Preservative Free 08/28/2020 (63 y.o.)      Influenza, Unspecified 06/11/2016 (63 y.o.) 05/04/2019 (63 y.o.)     Influenza, split virus, trivalent, preservative 06/16/2013 (63 y.o.)      Pfizer SARS-CoV-2 Primary Series 12+ yrs 10/10/2019 (63 y.o.) 11/01/2019 (63 y.o.) 09/17/2020 (63 y.o.)    TDAP VACCINE (BOOSTRIX ,ADACEL) 7Y+ 04/22/2011 (63 y.o.) 10/01/2021 (63 y.o.)     Varicella Zoster [facility]) 18Y+ 08/11/2021 (63 y.o.)          Health Maintenance Status       Date Due Completion Dates   HIV Screening Never done ---   Hepatitis C Screening Never done ---   Pneumococcal Vaccine for Ages 50+ (1 of 2 - PCV) Never done ---   ZOSTER VACCINE (2 of 2) 10/06/2021 08/11/2021   Lung Cancer Screening 11/10/2022 11/10/2021, 12/20/2019   COVID-19 Vaccine (6 - 2024-25 season) 05/23/2023 09/17/2020, 11/01/2019   Breast Cancer Screening (Mammogram) 10/28/2023 10/27/2022, 09/29/2021   Comprehensive Annual Visit 12/01/2023 12/01/2022, 10/01/2021   Influenza Vaccine (1) 04/21/2024 08/28/2020, 05/04/2019   Cervical Cancer  Screening 12/04/2024 12/05/2019, 12/05/2019   Diabetes Screening 04/20/2025 04/20/2024, 04/18/2024   Depression Screening 04/20/2025 04/20/2024   DTaP/Tdap/Td Vaccines (4 - Td or Tdap) 05/18/2033 05/19/2023, 10/01/2021   Colorectal Cancer Screening 08/21/2033 08/24/2023, 08/24/2023   Adult RSV (60+ Years or Pregnancy) (1 - 1-dose 75+ series) 01/30/2038 ---       Allergies[1]  Current Medications[2]  The following portions of the patient's history were reviewed  and updated as appropriate: allergies, current medications, past medical history, past surgical history, past social history, past family history, and problem list.    REVIEW OF SYSTEMS   CONSTITUTIONAL: Appetite good, no fevers, night sweats or weight loss. HEAD: No unusual headaches. EYES: No visual changes, no eye pain. ENT: No hearing difficulties, no ear pain. CV: No chest pain or shortness of breath. RESPIRATORY: No cough or wheezing. GI: No difficulty swallowing,change in bowel habits, blood in the stool or black tarry . GU: No dysuria, urgency or incontinence or change in urinary habits. MS: No joint pain/swelling or musculoskeletal deformities. SKIN: No rashes. NEURO: No MS changes, no motor weakness, no sensory changes. ENDOCRINE: No polyuria/polydipsia, no heat intolerance. HEME/LYMPH: No easy bleeding/bruising or swollen nodes.  PHYSICAL EXAM  BP 96/67 (BP Location: Right arm, Patient Position: Lying)   Pulse 76   Ht 1.588 m (5' 2.5)   Wt 59.1 kg (130 lb 3.2 oz)   BMI 23.43 kg/m  Wt Readings from Last 3 Encounters:  04/20/24 59.1 kg (130 lb 3.2 oz)  01/06/24 57.8 kg (127 lb 6.4 oz)  11/29/23 59 kg (130 lb)   Orthostatic Vitals:   04/20/24 1413 04/20/24 1500 04/20/24 1502 04/20/24 1503  Patient Position: Sitting Lying Sitting Standing  Orthostatic BP: 87/63  95/66 94/65  Orthostatic Pulse: 86  84 90   Lying 96/67, HR 76    Physical Exam Constitutional:      Comments: wd, wn, pleasant, nad  HENT:     Right Ear: Tympanic membrane and ear canal normal.     Left Ear: Tympanic membrane and ear canal normal.     Mouth/Throat:     Mouth: Mucous membranes are moist.     Pharynx: Oropharynx is clear.  Eyes:     Extraocular Movements: Extraocular movements intact.     Conjunctiva/sclera: Conjunctivae normal.     Pupils: Pupils are equal, round, and reactive to light.  Neck:     Comments: supple, nontender, no cervical adenopathy; Thyroid - no thyromegaly, no  nodules, nontender Cardiovascular:     Rate and Rhythm: Normal rate and regular rhythm.     Heart sounds: Normal heart sounds. No murmur heard. Pulmonary:     Effort: Pulmonary effort is normal. No respiratory distress.     Breath sounds: Normal breath sounds. No wheezing.  Abdominal:     General: Bowel sounds are normal. There is no distension.     Palpations: Abdomen is soft. There is no mass.     Tenderness: There is no abdominal tenderness. There is no guarding.  Musculoskeletal:     Right lower leg: No edema.     Left lower leg: No edema.     Comments: 5/5 strength throughout  Skin:    General: Skin is warm and dry.  Neurological:     Comments: Cns II-XII intact, no focal deficits, 2+ DTR symmetric throughout  Psychiatric:        Mood and Affect: Mood normal.        Behavior: Behavior normal.    Results  for orders placed or performed in visit on 04/20/24  Vitamin B12   Collection Time: 04/20/24  3:16 PM  Result Value Ref Range   Vitamin B-12 >1,500 (H) 180 - 914 pg/mL  Anemia Profile   Collection Time: 04/20/24  3:16 PM  Result Value Ref Range   Iron 145 50 - 212 ug/dL   Transferrin 872 (L) 796 - 362 mg/dL   Ferritin 376 (H) 11 - 307 ng/mL   Total Iron Binding Capacity (TIBC) 182 (L) 290 - 518 ug/dL   Transferrin Saturation 80 (H) 15 - 45 %  Magnesium   Collection Time: 04/20/24  3:16 PM  Result Value Ref Range   Magnesium 1.6 (L) 1.9 - 2.7 mg/dL  Lipase   Collection Time: 04/20/24  3:16 PM  Result Value Ref Range   Lipase 195 (H) 11 - 82 U/L  Comprehensive Metabolic Panel   Collection Time: 04/20/24  3:16 PM  Result Value Ref Range   Sodium 129 (L) 136 - 145 mmol/L   Potassium 3.2 (L) 3.5 - 5.1 mmol/L   Chloride 90 (L) 98 - 107 mmol/L   CO2 28 21 - 31 mmol/L   Anion Gap 11 6 - 14 mmol/L   Glucose, Random 101 (H) 70 - 99 mg/dL   Blood Urea Nitrogen (BUN) 6 (L) 7 - 25 mg/dL   Creatinine 9.21 9.39 - 1.20 mg/dL   eGFR 86 >40 fO/fpw/8.26f7   Albumin 3.4 (L)  3.5 - 5.7 g/dL   Total Protein 5.4 (L) 6.4 - 8.9 g/dL   Bilirubin, Total 1.0 0.3 - 1.0 mg/dL   Alkaline Phosphatase (ALP) 90 34 - 104 U/L   Aspartate Aminotransferase (AST) 132 (H) 13 - 39 U/L   Alanine Aminotransferase (ALT) 85 (H) 7 - 52 U/L   Calcium 8.6 8.6 - 10.3 mg/dL   BUN/Creatinine Ratio     Corrected Calcium 9.1 mg/dL    ASSESSMENT/PLAN   Diagnoses and all orders for this visit:  Physical exam Reviewed labs Mammo ordered Utd pap Utd colonoscopy Defers imms  Primary hypertension Currently hypotensive likely due to combination of poor fluid intake secondary to ongoing nausea and diuretic (losartan/hydrochlorothiazide). Pt to hold bp med over the next several days. She does have a cuff at home and will monitor her blood pressure. Resume medication if bp reaches 130/80 or higher.   Mixed hyperlipidemia Significant decrease in HDL (from 92 last year to 15 currently), possibly secondary to underlying liver disease (LFTs elevated). Chol and ldl controlled.   Elevated liver function tests Likely secondary to alcohol use. AST/ALT ratio 1.55 suggesting elevations secondary to alcohol/cirrhosis. Repeat labs. Get RUQ US . Pt strongly encouraged to stop alcohol. Avoid tylenol.   -     US  Abdomen Limited; Future -     Comprehensive Metabolic Panel; Future  Alcohol use Pt states she has drank alcohol for years; 3-4 glasses/night over the past year. Discussed adverse effect on liver and bone marrow. Discussed females should have one drink max/day but with her current LFT elevation, she should avoid alcohol altogether. She verbalized understanding.   -     Anemia Profile; Future -     Magnesium; Future  Generalized anxiety disorder Taking klonopin qam. Pt has revealed that she drinks 3-4 glasses of wine in the evening and has done so over the past year. Discussed with pt that she cannot mix the two as alcohol and benzodiazepines work in similar ways. She verbalized understanding. (Pt  already has script from 02/2024 -  90 tab for 90 days). Will discuss switching to buspar if pt is unable to cut out alcohol.   Nausea/Light headed Orthostatics nml Pt to hold bp medication (see above) Zofran sent Get additional labs   -     Lipase; Future -     Comprehensive Metabolic Panel; Future -     ondansetron (ZOFRAN-ODT) 4 mg disintegrating tablet; Dissolve 1 tablet (4 mg total) on tongue every 8 (eight) hours as needed for nausea or vomiting.    History of tobacco abuse -     CT LDCT Lung Screening Annual; Future  Macrocytosis Likely secondary to alcohol abuse.  Will check b12 levels.  -     Vitamin B12; Future   Encounter for screening mammogram for malignant neoplasm of breast -     [facility] Breast Screening Tomo Bilat; Future   Return for will arrange after labs.   This document serves as a record of services personally performed by Twyla Silversmith, MD.  It was created on their behalf by SUZEN BOTTCHER, CMA, a trained medical scribe, and Certified Medical Assistant (CMA). During the course of documenting the history, physical exam and medical decision making, I was functioning as a Stage manager. The creation of this record is the provider's dictation and/or activities during the visit.  Electronically signed by SUZEN BOTTCHER, CMA 04/20/2024 9:04 PM     The above note has been reviewed for accuracy.  Twyla Madelynn Silversmith, MD        [1] Allergies Allergen Reactions  . Nickel Other (See Comments)    Unknown  [2]  Current Outpatient Medications:  .  clonazePAM (KlonoPIN) 0.5 mg tablet, TAKE 1 TABLET BY MOUTH DAILY, Disp: 90 tablet, Rfl: 0 .  losartan-hydroCHLOROthiazide (HYZAAR) 100-12.5 mg per tablet, TAKE 1 TABLET BY MOUTH DAILY, Disp: 90 tablet, Rfl: 3 .  omeprazole (PriLOSEC) 20 mg DR capsule, TAKE ONE CAPSULE BY MOUTH TWICE A DAY BEFORE A MEAL, Disp: 180 capsule, Rfl: 1 .  simvastatin (ZOCOR) 40 mg tablet, TAKE 1 TABLET(40 MG) BY MOUTH  DAILY, Disp: 90 tablet, Rfl: 3 .  olopatadine (PATANOL) 0.1 % ophthalmic solution, Administer 1 drop into each eyes 2 (two) times a day., Disp: 5 mL, Rfl: 0 .  ondansetron (ZOFRAN-ODT) 4 mg disintegrating tablet, Dissolve 1 tablet (4 mg total) on tongue every 8 (eight) hours as needed for nausea or vomiting., Disp: 20 tablet, Rfl: 0 .  potassium chloride  (KLOR-CON ) 10 mEq ER tablet, Take 2 tablets (20 mEq total) by mouth daily for 3 days., Disp: 6 tablet, Rfl: 0

## 2024-04-21 ENCOUNTER — Other Ambulatory Visit: Payer: Self-pay

## 2024-04-21 ENCOUNTER — Encounter (HOSPITAL_BASED_OUTPATIENT_CLINIC_OR_DEPARTMENT_OTHER): Payer: Self-pay | Admitting: Emergency Medicine

## 2024-04-21 ENCOUNTER — Emergency Department (HOSPITAL_BASED_OUTPATIENT_CLINIC_OR_DEPARTMENT_OTHER)
Admission: EM | Admit: 2024-04-21 | Discharge: 2024-04-21 | Disposition: A | Discharge status: Home / Self Care | Source: Ambulatory Visit | Attending: Emergency Medicine | Admitting: Emergency Medicine

## 2024-04-21 ENCOUNTER — Emergency Department (HOSPITAL_BASED_OUTPATIENT_CLINIC_OR_DEPARTMENT_OTHER)

## 2024-04-21 DIAGNOSIS — R1084 Generalized abdominal pain: Secondary | ICD-10-CM | POA: Insufficient documentation

## 2024-04-21 DIAGNOSIS — R748 Abnormal levels of other serum enzymes: Secondary | ICD-10-CM | POA: Diagnosis not present

## 2024-04-21 DIAGNOSIS — R7989 Other specified abnormal findings of blood chemistry: Secondary | ICD-10-CM | POA: Insufficient documentation

## 2024-04-21 DIAGNOSIS — R0602 Shortness of breath: Secondary | ICD-10-CM | POA: Insufficient documentation

## 2024-04-21 DIAGNOSIS — R7401 Elevation of levels of liver transaminase levels: Secondary | ICD-10-CM | POA: Diagnosis not present

## 2024-04-21 DIAGNOSIS — E876 Hypokalemia: Secondary | ICD-10-CM

## 2024-04-21 DIAGNOSIS — K760 Fatty (change of) liver, not elsewhere classified: Secondary | ICD-10-CM

## 2024-04-21 LAB — COMPREHENSIVE METABOLIC PANEL WITH GFR
ALT: 110 U/L — ABNORMAL HIGH (ref 0–44)
AST: 177 U/L — ABNORMAL HIGH (ref 15–41)
Albumin: 3.7 g/dL (ref 3.5–5.0)
Alkaline Phosphatase: 106 U/L (ref 38–126)
Anion gap: 15 (ref 5–15)
BUN: 5 mg/dL — ABNORMAL LOW (ref 8–23)
CO2: 25 mmol/L (ref 22–32)
Calcium: 8.6 mg/dL — ABNORMAL LOW (ref 8.9–10.3)
Chloride: 89 mmol/L — ABNORMAL LOW (ref 98–111)
Creatinine, Ser: 0.92 mg/dL (ref 0.44–1.00)
GFR, Estimated: >60 mL/min (ref >60)
Glucose, Bld: 120 mg/dL — ABNORMAL HIGH (ref 70–99)
Potassium: 2.8 mmol/L — ABNORMAL LOW (ref 3.5–5.1)
Sodium: 130 mmol/L — ABNORMAL LOW (ref 135–145)
Total Bilirubin: 1.1 mg/dL (ref 0.0–1.2)
Total Protein: 5.9 g/dL — ABNORMAL LOW (ref 6.5–8.1)

## 2024-04-21 LAB — PRO BRAIN NATRIURETIC PEPTIDE: Pro Brain Natriuretic Peptide: 92 pg/mL (ref <300.0)

## 2024-04-21 LAB — URINALYSIS, W/ REFLEX TO CULTURE (INFECTION SUSPECTED)
Bilirubin Urine: NEGATIVE
Glucose, UA: NEGATIVE mg/dL
Ketones, ur: NEGATIVE mg/dL
Leukocytes,Ua: NEGATIVE
Nitrite: NEGATIVE
Protein, ur: NEGATIVE mg/dL
Specific Gravity, Urine: <=1.005 (ref 1.005–1.030)
WBC, UA: NONE SEEN WBC/hpf (ref 0–5)
pH: 7 (ref 5.0–8.0)

## 2024-04-21 LAB — CBC
HCT: 37.6 % (ref 36.0–46.0)
Hemoglobin: 13.8 g/dL (ref 12.0–15.0)
MCH: 34.4 pg — ABNORMAL HIGH (ref 26.0–34.0)
MCHC: 36.7 g/dL — ABNORMAL HIGH (ref 30.0–36.0)
MCV: 93.8 fL (ref 80.0–100.0)
Platelets: 261 K/uL (ref 150–400)
RBC: 4.01 MIL/uL (ref 3.87–5.11)
RDW: 13.5 % (ref 11.5–15.5)
WBC: 6.3 K/uL (ref 4.0–10.5)
nRBC: 0 % (ref 0.0–0.2)

## 2024-04-21 LAB — TROPONIN T, HIGH SENSITIVITY
Troponin T High Sensitivity: <15 ng/L (ref <19)
Troponin T High Sensitivity: <15 ng/L (ref <19)

## 2024-04-21 LAB — LIPASE, BLOOD: Lipase: 126 U/L — ABNORMAL HIGH (ref 11–51)

## 2024-04-21 LAB — PROTIME-INR
INR: 1 (ref 0.8–1.2)
Prothrombin Time: 14 s (ref 11.4–15.2)

## 2024-04-21 LAB — MAGNESIUM: Magnesium: 1.7 mg/dL (ref 1.7–2.4)

## 2024-04-21 LAB — AMMONIA: Ammonia: 23 umol/L (ref 9–35)

## 2024-04-21 MED ORDER — IOHEXOL 300 MG/ML  SOLN
100.0000 mL | Freq: Once | INTRAMUSCULAR | Status: AC | PRN
  Administered 2024-04-21: 100 mL via INTRAVENOUS

## 2024-04-21 MED ORDER — POTASSIUM CHLORIDE CRYS ER 20 MEQ PO TBCR
40.0000 meq | EXTENDED_RELEASE_TABLET | Freq: Once | ORAL | Status: AC
  Administered 2024-04-21: 40 meq via ORAL
  Filled 2024-04-21: qty 2

## 2024-04-21 MED ORDER — POTASSIUM CHLORIDE 10 MEQ/100ML IV SOLN
10.0000 meq | Freq: Once | INTRAVENOUS | Status: DC
  Filled 2024-04-21: qty 100

## 2024-04-21 MED ORDER — SODIUM CHLORIDE 0.9 % IV BOLUS
1000.0000 mL | Freq: Once | INTRAVENOUS | Status: AC
  Administered 2024-04-21: 1000 mL via INTRAVENOUS

## 2024-04-21 NOTE — ED Triage Notes (Signed)
 Patient reports she was seen by her PCP yesterday for annual exam, had labs. Was called today and told her sodium was low and liver enzymes were high. Sent here for eval and ct scan. Patient c/o a stomach ache denies additional sx.  Daily etoh use. 3-4 drinks per night.  Reports h/o htn but was removed from meds yesterday due to BP being low.

## 2024-04-21 NOTE — ED Provider Notes (Signed)
 Past Medical History:  Diagnosis Date   Colitis     Physical Exam  BP 123/68   Pulse 70   Temp 97.8 F (36.6 C) (Oral)   Resp 16   Ht 5' 3 (1.6 m)   Wt 59 kg   SpO2 100%   BMI 23.03 kg/m   Physical Exam  Procedures  Procedures  ED Course / MDM    Received care of patient from Dr. Norman.  Please see his note for prior history, physical and care.  Briefly is a 63 year old female with a history of hypertension, GERD, who presents with concern for fatigue, low appetite, nausea, abdominal discomfort who had outpatient labs showing mild elevation in lipase with PCP to 195, sodium 129, mild transaminitis.  Labs completed showed normal ammonia, normal INR, normal proBNP, normal troponin.  His lipase is 126, elevated but not consistent with acute pancreatitis.  CMP shows mild hyponatremia with a sodium of 130, hypokalemia with a potassium of 2.8, no anemia or leukocytosis, mag of 1.7.  CT abdomen pelvis is pending at time of transfer of care.  CT without acute abnormalities. Findings of steatosis, hepatitis. Discussed alcohol cessation, discussion with PCP of symptoms and return precautions. Patient discharged in stable condition with understanding of reasons to return.      Dreama Longs, MD 04/22/24 1301

## 2024-04-21 NOTE — Discharge Instructions (Addendum)
 It was a pleasure caring for you today. Your urine does not show signs of UTI.  Take the potassium as prescribed by your doctor.

## 2024-04-21 NOTE — ED Provider Notes (Signed)
 [facility] Provider Note   CSN: [id number] Arrival date & time: 04/21/24  1325     Patient presents with: Abdominal Pain   Jocelyn West is a 63 y.o. female.   The history is provided by the patient and medical records. No language interpreter was used.  Abdominal Pain Pain location:  Generalized Pain quality: aching   Pain radiates to:  Does not radiate Pain severity:  Moderate Onset quality:  Gradual Duration:  1 week Timing:  Constant Progression:  Waxing and waning Chronicity:  New Context: alcohol use   Relieved by:  Nothing Worsened by:  Nothing Ineffective treatments:  None tried Associated symptoms: fatigue and shortness of breath   Associated symptoms: no chest pain, no chills, no constipation, no cough, no diarrhea, no dysuria, no fever, no nausea and no vomiting        Prior to Admission medications   Medication Sig Start Date End Date Taking? Authorizing Provider  clonazePAM (KLONOPIN) 0.5 MG tablet Take 0.5 mg by mouth 2 (two) times daily as needed for anxiety.    [provider]  LOSARTAN POTASSIUM PO Take by mouth.    [provider]  metoCLOPramide  (REGLAN ) 10 MG tablet Take 1 tablet (10 mg total) by mouth every 6 (six) hours. 07/02/18   Couture, Cortni S, PA-C  SIMVASTATIN PO Take by mouth.    [provider]    Allergies: Nickel    Review of Systems  Constitutional:  Positive for fatigue. Negative for chills, diaphoresis and fever.  Respiratory:  Positive for shortness of breath. Negative for cough.   Cardiovascular:  Negative for chest pain, palpitations and leg swelling.  Gastrointestinal:  Positive for abdominal pain. Negative for constipation, diarrhea, nausea and vomiting.  Genitourinary:  Negative for dysuria, flank pain and frequency.  Musculoskeletal:  Negative for back pain.  Skin:  Negative for rash and wound.  Neurological:  Negative for light-headedness and  headaches.  Psychiatric/Behavioral:  Negative for agitation and confusion.   All other systems reviewed and are negative.   Updated Vital Signs BP 97/71 (BP Location: Left Arm)   Pulse 86   Temp 97.8 F (36.6 C) (Oral)   Resp 17   Ht 5' 3 (1.6 m)   Wt 59 kg   SpO2 98%   BMI 23.03 kg/m   Physical Exam Vitals and nursing note reviewed.  Constitutional:      General: She is not in acute distress.    Appearance: She is well-developed. She is not ill-appearing, toxic-appearing or diaphoretic.  HENT:     Head: Normocephalic and atraumatic.  Eyes:     General: No scleral icterus.    Extraocular Movements: Extraocular movements intact.     Conjunctiva/sclera: Conjunctivae normal.     Pupils: Pupils are equal, round, and reactive to light.  Cardiovascular:     Rate and Rhythm: Normal rate and regular rhythm.     Heart sounds: No murmur heard. Pulmonary:     Effort: Pulmonary effort is normal. No respiratory distress.     Breath sounds: Normal breath sounds.  Abdominal:     General: Abdomen is flat. Bowel sounds are normal.     Palpations: Abdomen is soft. There is no shifting dullness.     Tenderness: There is no abdominal tenderness. There is no right CVA tenderness, left CVA tenderness, guarding or rebound.  Musculoskeletal:        General: No swelling.     Cervical  back: Neck supple.  Skin:    General: Skin is warm and dry.     Capillary Refill: Capillary refill takes less than 2 seconds.     Coloration: Skin is not pale.     Findings: No rash.  Neurological:     General: No focal deficit present.     Mental Status: She is alert.  Psychiatric:        Mood and Affect: Mood normal.     (all labs ordered are listed, but only abnormal results are displayed) Labs Reviewed  COMPREHENSIVE METABOLIC PANEL WITH GFR - Abnormal; Notable for the following components:      Result Value   Sodium 130 (*)    Potassium 2.8 (*)    Chloride 89 (*)    Glucose, Bld 120 (*)    BUN  5 (*)    Calcium 8.6 (*)    Total Protein 5.9 (*)    AST 177 (*)    ALT 110 (*)    All other components within normal limits  CBC - Abnormal; Notable for the following components:   MCH 34.4 (*)    MCHC 36.7 (*)    All other components within normal limits  LIPASE, BLOOD - Abnormal; Notable for the following components:   Lipase 126 (*)    All other components within normal limits  MAGNESIUM  AMMONIA  PROTIME-INR  PRO BRAIN NATRIURETIC PEPTIDE  URINALYSIS, W/ REFLEX TO CULTURE (INFECTION SUSPECTED)  TROPONIN T, HIGH SENSITIVITY    EKG: EKG Interpretation Date/Time:  Friday April 21 2024 14:40:14 EDT Ventricular Rate:  72 PR Interval:  139 QRS Duration:  98 QT Interval:  411 QTC Calculation: 450 R Axis:   27  Text Interpretation: Sinus rhythm Low voltage, precordial leads when compared to prior, similar appearance No STEMI Confirmed by Ginger Barefoot ([id number]) on 04/21/2024 2:42:06 PM  Radiology: No results found.   Procedures   Medications Ordered in the ED  potassium chloride  10 mEq in 100 mL IVPB (has no administration in time range)  sodium chloride  0.9 % bolus 1,000 mL (1,000 mLs Intravenous New Bag/Given 04/21/24 1437)  potassium chloride  SA (KLOR-CON  M) CR tablet 40 mEq (40 mEq Oral Given 04/21/24 1435)  iohexol (OMNIPAQUE) 300 MG/ML solution 100 mL (100 mLs Intravenous Contrast Given 04/21/24 1525)                                    Medical Decision Making Amount and/or Complexity of Data Reviewed Labs: ordered. Radiology: ordered.  Risk Prescription drug management.    Jocelyn West is a 63 y.o. female with a past medical history significant previous cesarean section and previous colitis who presents at the direction of PCP for evaluation of abdominal pain, exertional shortness of breath, fatigue, and lab abnormalities.  According to patient, she does drink about 3 glasses of wine a night and has taken Tylenol for the last few days with the abdominal  discomfort.  She reports she was called yesterday and told to go to Emergency Department because she had LFT elevation and lipase elevation.  She reports no nausea or vomiting and denies any new constipation or diarrhea.  Denies any urinary changes or vaginal complaints.  She denies trauma.  She reports mild to moderate discomfort across her abdomen and bloating for the last month or so and has had about a month of exertional shortness of breath and fatigue.  Denies significant leg pain or leg swelling has no history of heart failure.  Denies history of any abdominal complaints.  Denies any history of liver failure or pancreatitis.  Denies any history of gallbladder or appendix problems.  Denies any fevers, chills, chest pain or palpitations.  On exam, lungs clear.  Chest nontender.  Abdomen was diffusely tender slightly and had some intact bowel sounds.  Back and flanks nontender.  Good pulses in extremities.  Legs nontender and nonedematous.  She does have dry mucous membranes and is concerned that she is dehydrated would like some fluids.  Will give some fluids as she does appear slightly hydrated but her abdomen is tender with this reported LFT elevation and lipase elevation.  Will get CT to look for cholecystitis or other intra-abdominal problem causing these lab findings and will give her some potassium orally as she says she does not want IV potassium at this time.  Will add on other liver function testing including INR and ammonia and will get a BNP given the report that she is have exertional shortness breath.  Will get EKG, chest x-ray and troponin.  If workup does not show other critical abnormalities, dissipate she is likely to be safe for discharge home to follow-up with her PCP for further outpatient workup of this LFT elevation that may be related to chronic alcohol use.  She reports only took 1 Tylenol over the last few days and I have low suspicion for Tylenol overdose  accidentally.  Anticipate care be transferred oncoming team to wait for workup results and imaging.  Care transferred oncoming team to wait for workup results and reassessment.  Anticipate discharge if workup reassuring.      Final diagnoses:  Generalized abdominal pain  LFT elevation  Elevated lipase    Clinical Impression: 1. Generalized abdominal pain   2. LFT elevation   3. Elevated lipase     Disposition: Admit  This note was prepared with assistance of Dragon voice recognition software. Occasional wrong-word or sound-a-like substitutions may have occurred due to the inherent limitations of voice recognition software.       Mahathi Pokorney, Lonni PARAS, MD 04/21/24 1536
